# Patient Record
Sex: Female | Born: 1980 | Race: Black or African American | Hispanic: No | Marital: Single | Smoking: Current every day smoker
Health system: Southern US, Community
[De-identification: ages and names within clinical notes are randomized; demographics above are authoritative.]

## PROBLEM LIST (undated history)

## (undated) DIAGNOSIS — K7689 Other specified diseases of liver: Secondary | ICD-10-CM

## (undated) DIAGNOSIS — M6208 Separation of muscle (nontraumatic), other site: Secondary | ICD-10-CM

## (undated) DIAGNOSIS — G43909 Migraine, unspecified, not intractable, without status migrainosus: Secondary | ICD-10-CM

## (undated) DIAGNOSIS — E559 Vitamin D deficiency, unspecified: Secondary | ICD-10-CM

---

## 2013-01-10 HISTORY — PX: OTHER SURGICAL HISTORY: SHX169

## 2020-02-02 ENCOUNTER — Emergency Department (HOSPITAL_COMMUNITY)
Admission: EM | Admit: 2020-02-02 | Discharge: 2020-02-02 | Disposition: A | Discharge status: Home / Self Care | Payer: Medicaid Other | Attending: Emergency Medicine | Admitting: Emergency Medicine

## 2020-02-02 ENCOUNTER — Other Ambulatory Visit: Payer: Self-pay

## 2020-02-02 DIAGNOSIS — T148XXA Other injury of unspecified body region, initial encounter: Secondary | ICD-10-CM

## 2020-02-02 DIAGNOSIS — S0993XA Unspecified injury of face, initial encounter: Secondary | ICD-10-CM | POA: Diagnosis present

## 2020-02-02 DIAGNOSIS — S0083XA Contusion of other part of head, initial encounter: Secondary | ICD-10-CM | POA: Diagnosis not present

## 2020-02-02 DIAGNOSIS — X58XXXA Exposure to other specified factors, initial encounter: Secondary | ICD-10-CM | POA: Diagnosis not present

## 2020-02-02 NOTE — ED Triage Notes (Signed)
Patient reports she had a random hematoma show up on her head and then it went down but it is still tender and uncomfortable

## 2020-02-02 NOTE — ED Provider Notes (Signed)
[facility]-EMERGENCY DEPT Provider Note   CSN: [id number] Arrival date & time: 02/02/20  1359     History Chief Complaint  Patient presents with  . Facial Pain    Fabiana Dromgoole is a 40 y.o. female with no apparent past medical history that presents the emergency department today for facial hematoma that occurred yesterday.  Patient states that she had a random hematoma show up on her forehead yesterday, was a size of a nickel, stayed for a couple hours and then it disappeared.  Is not currently having hematoma.  Patient states that she came to get evaluated for this.  Does not have a picture of this.  States that the area is a little tender to touch right now. Has completley resolved. Has not taken anything for this.  Patient also states that she has some numbness around her eye.  Denies any vision changes, no blurry vision, photophobia, headaches, head trauma.  Denies any confusion, weakness, numbness, tingling, gait abnormality, neglect.  Denies any chest pain or shortness of breath.  Denies any fevers or chills.  Denies any nausea or vomiting.  Denies any bug bites that she is aware of.  States that it did not feel like a hive.  States that she is never had anything like this before.  Denies any unusual contacts or new contacts anything that she is aware of.  No other complaints at this time.  Denies any facial droop.    HPI     No past medical history on file.  There are no problems to display for this patient.   OB History   No obstetric history on file.     No family history on file.     Home Medications Prior to Admission medications   Not on File    Allergies    Amoxicillin  Review of Systems   Review of Systems  Constitutional: Negative for diaphoresis, fatigue and fever.  HENT: Negative for dental problem, ear pain, facial swelling, sinus pressure, sinus pain, trouble swallowing and voice change.        Hematoma  Eyes: Negative for visual  disturbance.  Respiratory: Negative for shortness of breath.   Cardiovascular: Negative for chest pain.  Gastrointestinal: Negative for nausea and vomiting.  Musculoskeletal: Negative for back pain and myalgias.  Skin: Negative for color change, pallor, rash and wound.  Neurological: Negative for syncope, weakness, light-headedness, numbness and headaches.  Psychiatric/Behavioral: Negative for behavioral problems and confusion.    Physical Exam Updated Vital Signs BP (!) 136/93 (BP Location: Right Arm)   Pulse 89   Temp 98.3 F (36.8 C) (Oral)   Resp 17   Ht 5\' 4"  (1.626 m)   Wt 69.4 kg   LMP 01/17/2020   SpO2 100%   BMI 26.26 kg/m   Physical Exam Constitutional:      General: She is not in acute distress.    Appearance: Normal appearance. She is not ill-appearing, toxic-appearing or diaphoretic.  HENT:     Head: Normocephalic and atraumatic. No raccoon eyes, right periorbital erythema or left periorbital erythema.     Salivary Glands: Right salivary gland is not tender.     Comments: No hematoma. No facial abnormalities. No true tenderness to frontal or maxillary area. No erythema or warmth. No facial swelling. Eyes:     General: Lids are normal. Lids are everted, no foreign bodies appreciated. Vision grossly intact. Gaze aligned appropriately.        Right eye: No  discharge.        Left eye: No discharge.     Extraocular Movements: Extraocular movements intact.     Right eye: Normal extraocular motion and no nystagmus.     Left eye: Normal extraocular motion and no nystagmus.     Conjunctiva/sclera: Conjunctivae normal.  Cardiovascular:     Rate and Rhythm: Normal rate and regular rhythm.     Pulses: Normal pulses.  Pulmonary:     Effort: Pulmonary effort is normal.     Breath sounds: Normal breath sounds.  Musculoskeletal:        General: Normal range of motion.  Skin:    General: Skin is warm and dry.     Capillary Refill: Capillary refill takes less than 2  seconds.  Neurological:     Mental Status: She is alert.     Comments: Alert. Clear speech. No facial droop. CNIII-XII grossly intact. Bilateral upper and lower extremities' sensation grossly intact. 5/5 symmetric strength with grip strength and with plantar and dorsi flexion bilaterally. Patellar DTRs are 2+ and symmetric . Normal finger to nose bilaterally. Negative pronator drift. Negative Romberg sign. Gait is steady and intact    Psychiatric:        Mood and Affect: Mood normal.        Behavior: Behavior normal.        Thought Content: Thought content normal.     ED Results / Procedures / Treatments   Labs (all labs ordered are listed, but only abnormal results are displayed) Labs Reviewed - No data to display  EKG None  Radiology No results found.  Procedures Procedures (including critical care time)  Medications Ordered in ED Medications - No data to display  ED Course  I have reviewed the triage vital signs and the nursing notes.  Pertinent labs & imaging results that were available during my care of the patient were reviewed by me and considered in my medical decision making (see chart for details).    MDM Rules/Calculators/A&P                         Kearah Gayden is a 40 y.o. female with no apparent past medical history that presents the emergency department today for facial hematoma that occurred yesterday. Patient does not currently have hematoma. Patient has completely normal neuro exam. No pain or facial tenderness palpated. Area is not warm, no concerns for cellulitis. No visual changes, visual acuity normal. No pain in eye. Unsure why patient had symptoms, did discuss patient could have had an insect bite or allergic reaction to something.  Symptomatic treatment discussed, patient to follow-up with PCP. Patient left before discharge paperwork was printed. Strict return precautions given to patient.  Doubt need for further emergent work up at this time. I  explained the diagnosis and have given explicit precautions to return to the ER including for any other new or worsening symptoms. The patient understands and accepts the medical plan as it's been dictated and I have answered their questions. Discharge instructions concerning home care and prescriptions have been given. The patient is STABLE and is discharged to home in good condition.  Final Clinical Impression(s) / ED Diagnoses Final diagnoses:  Hematoma    Rx / DC Orders ED Discharge Orders    None       Farrel Gordon, PA-C 02/02/20 1537    Bethann Berkshire, MD 02/03/20 2257

## 2020-05-19 ENCOUNTER — Emergency Department (HOSPITAL_COMMUNITY)
Admission: EM | Admit: 2020-05-19 | Discharge: 2020-05-19 | Disposition: A | Discharge status: Home / Self Care | Payer: Medicaid Other | Attending: Emergency Medicine | Admitting: Emergency Medicine

## 2020-05-19 ENCOUNTER — Emergency Department (HOSPITAL_COMMUNITY): Payer: Medicaid Other

## 2020-05-19 ENCOUNTER — Other Ambulatory Visit: Payer: Self-pay

## 2020-05-19 ENCOUNTER — Encounter (HOSPITAL_COMMUNITY): Payer: Self-pay | Admitting: Emergency Medicine

## 2020-05-19 DIAGNOSIS — W01198A Fall on same level from slipping, tripping and stumbling with subsequent striking against other object, initial encounter: Secondary | ICD-10-CM | POA: Diagnosis not present

## 2020-05-19 DIAGNOSIS — Y92002 Bathroom of unspecified non-institutional (private) residence single-family (private) house as the place of occurrence of the external cause: Secondary | ICD-10-CM | POA: Diagnosis not present

## 2020-05-19 DIAGNOSIS — S0990XA Unspecified injury of head, initial encounter: Secondary | ICD-10-CM | POA: Diagnosis present

## 2020-05-19 HISTORY — DX: Migraine, unspecified, not intractable, without status migrainosus: G43.909

## 2020-05-19 MED ORDER — ACETAMINOPHEN 325 MG PO TABS
650.0000 mg | ORAL_TABLET | Freq: Once | ORAL | Status: AC
  Administered 2020-05-19: 650 mg via ORAL
  Filled 2020-05-19: qty 2

## 2020-05-19 NOTE — ED Provider Notes (Signed)
Emergency Medicine Provider Triage Evaluation Note  Haley Gill , a 40 y.o. female  was evaluated in triage.  Pt complains of headache.  Patient states yesterday she tripped and fell and hit her head.  She was knocked out, but does not know for how long.  Today she continues to have pain of her right forehead.  She also reports numbness sensation of her forehead.  She is not taken anything including Tylenol ibuprofen.  She is not on blood thinners.  No neck or back pain.  No numbness or tingling.  No nausea or vomiting.  She does have photophobia.  History of migraines, states this feels different..  Review of Systems  Positive: HA, photophobia Negative: Neck pain  Physical Exam  BP (!) 139/94 (BP Location: Right Arm)   Pulse (!) 114   Temp 99.1 F (37.3 C) (Oral)   Resp 18   LMP 05/10/2020   SpO2 100%  Gen:   Awake, no distress   Resp:  Normal effort  MSK:   Moves extremities without difficulty  Other:  Chest palpation of the right forehead.  No obvious laceration.  No trismus or malocclusion.  Full active range of motion of the head without midline tenderness.  Medical Decision Making  Medically screening exam initiated at 4:32 PM.  Appropriate orders placed.  Haley Gill was informed that the remainder of the evaluation will be completed by another provider, this initial triage assessment does not replace that evaluation, and the importance of remaining in the ED until their evaluation is complete.  Ct ordered   Alveria Apley, PA-C 05/19/20 1634    Margarita Grizzle, MD 05/19/20 2351

## 2020-05-19 NOTE — Discharge Instructions (Addendum)
Take Tylenol or ibuprofen as needed for pain. Use ice to help with pain and swelling. Return to the emergency room if you develop severe worsening head pain, vision changes, slurred speech, double vision, or any new, sudden, or concerning symptoms.

## 2020-05-19 NOTE — ED Provider Notes (Signed)
[facility]-EMERGENCY DEPT Provider Note   CSN: [id number] Arrival date & time: 05/19/20  1609     History Chief Complaint  Patient presents with  . Head Injury    Haley Gill is a 40 y.o. female presenting for evaluation of headache.  Patient states last night she tripped over a box and fell, hitting her head on the bathtub.  She had loss of consciousness.  She continues to have headache and head pain today.  No vision changes, slurred speech, dizziness, lightheadedness.  No neck pain.  No numbness or tingling of the upper extremities.  She has not taken anything including Tylenol ibuprofen.  No nausea, vomiting, diarrhea.  HPI     Past Medical History:  Diagnosis Date  . Migraines     There are no problems to display for this patient.   History reviewed. No pertinent surgical history.   OB History   No obstetric history on file.     No family history on file.  Social History   Substance Use Topics  . Alcohol use: Yes    Home Medications Prior to Admission medications   Not on File    Allergies    Amoxicillin  Review of Systems   Review of Systems  Neurological: Positive for headaches.  All other systems reviewed and are negative.   Physical Exam Updated Vital Signs BP (!) 131/93 (BP Location: Left Arm)   Pulse 94   Temp 99.2 F (37.3 C) (Oral)   Resp 16   LMP 05/10/2020   SpO2 96%   Physical Exam Vitals and nursing note reviewed.  Constitutional:      General: She is not in acute distress.    Appearance: She is well-developed.     Comments: Resting in the bed in NAD  HENT:     Head: Normocephalic.      Comments: TTP of the R forehead with ?mild swelling.  No laceration.  No trismus or malocclusion.  EOMI without entrapment.  No tenderness palpation over the nasal bridge Eyes:     Conjunctiva/sclera: Conjunctivae normal.     Pupils: Pupils are equal, round, and reactive to light.  Cardiovascular:     Rate and  Rhythm: Normal rate and regular rhythm.     Pulses: Normal pulses.  Pulmonary:     Effort: Pulmonary effort is normal. No respiratory distress.     Breath sounds: Normal breath sounds. No wheezing.  Abdominal:     General: There is no distension.     Palpations: Abdomen is soft. There is no mass.     Tenderness: There is no abdominal tenderness. There is no guarding or rebound.  Musculoskeletal:        General: Normal range of motion.     Cervical back: Normal range of motion and neck supple.  Skin:    General: Skin is warm and dry.     Capillary Refill: Capillary refill takes less than 2 seconds.  Neurological:     Mental Status: She is alert and oriented to person, place, and time.     ED Results / Procedures / Treatments   Labs (all labs ordered are listed, but only abnormal results are displayed) Labs Reviewed - No data to display  EKG None  Radiology CT Head Wo Contrast  Result Date: 05/19/2020 CLINICAL DATA:  Head trauma, moderate to severe. EXAM: CT HEAD WITHOUT CONTRAST TECHNIQUE: Contiguous axial images were obtained from the base of the skull through the vertex without  intravenous contrast. COMPARISON:  None. FINDINGS: Brain: No acute intracranial hemorrhage. No focal mass lesion. No CT evidence of acute infarction. No midline shift or mass effect. No hydrocephalus. Basilar cisterns are patent. Vascular: No hyperdense vessel or unexpected calcification. Skull: Normal. Negative for fracture or focal lesion. Sinuses/Orbits: Paranasal sinuses and mastoid air cells are clear. Orbits are clear. Other: None. IMPRESSION: No intracranial trauma. Electronically Signed   By: Genevive Bi M.D.   On: 05/19/2020 17:56    Procedures Procedures   Medications Ordered in ED Medications  acetaminophen (TYLENOL) tablet 650 mg (has no administration in time range)    ED Course  I have reviewed the triage vital signs and the nursing notes.  Pertinent labs & imaging results that  were available during my care of the patient were reviewed by me and considered in my medical decision making (see chart for details).    MDM Rules/Calculators/A&P                          Patient presenting for evaluation of headache and head pain after fall yesterday.  On exam, patient peers nontoxic.  She is neurovascular intact.  CT head negative for acute findings.  Discussed with patient.  Discussed symptomatic management.  At this time, patient appears safe for discharge.  Return precautions given.  Patient states she understands and agrees to plan.  Final Clinical Impression(s) / ED Diagnoses Final diagnoses:  Injury of head, initial encounter    Rx / DC Orders ED Discharge Orders    None       Alveria Apley, PA-C 05/19/20 1910    Margarita Grizzle, MD 05/20/20 [phone]

## 2020-05-19 NOTE — ED Triage Notes (Signed)
Pt reports last night she had two glasses of wine and she tripped over something in her bathroom and fell hitting her head on her bath tub. Reports that her boyfriend found her on the ground in bathroom. Unsure how long she was knocked out for. Reports headache and loss of sensation on her forehead when she touches.

## 2020-05-24 ENCOUNTER — Other Ambulatory Visit: Payer: Self-pay

## 2020-05-24 ENCOUNTER — Emergency Department (HOSPITAL_COMMUNITY)
Admission: EM | Admit: 2020-05-24 | Discharge: 2020-05-24 | Disposition: A | Discharge status: Home / Self Care | Payer: Medicaid Other | Attending: Emergency Medicine | Admitting: Emergency Medicine

## 2020-05-24 ENCOUNTER — Encounter (HOSPITAL_COMMUNITY): Payer: Self-pay | Admitting: Emergency Medicine

## 2020-05-24 DIAGNOSIS — T148XXA Other injury of unspecified body region, initial encounter: Secondary | ICD-10-CM

## 2020-05-24 DIAGNOSIS — S060X9D Concussion with loss of consciousness of unspecified duration, subsequent encounter: Secondary | ICD-10-CM | POA: Diagnosis not present

## 2020-05-24 DIAGNOSIS — W01198D Fall on same level from slipping, tripping and stumbling with subsequent striking against other object, subsequent encounter: Secondary | ICD-10-CM | POA: Diagnosis not present

## 2020-05-24 DIAGNOSIS — Y92002 Bathroom of unspecified non-institutional (private) residence single-family (private) house as the place of occurrence of the external cause: Secondary | ICD-10-CM | POA: Diagnosis not present

## 2020-05-24 DIAGNOSIS — S161XXD Strain of muscle, fascia and tendon at neck level, subsequent encounter: Secondary | ICD-10-CM | POA: Diagnosis not present

## 2020-05-24 DIAGNOSIS — S0990XD Unspecified injury of head, subsequent encounter: Secondary | ICD-10-CM | POA: Diagnosis present

## 2020-05-24 MED ORDER — ACETAMINOPHEN 500 MG PO TABS
1000.0000 mg | ORAL_TABLET | Freq: Once | ORAL | Status: AC
  Administered 2020-05-24: 1000 mg via ORAL
  Filled 2020-05-24: qty 2

## 2020-05-24 NOTE — ED Triage Notes (Signed)
Pt states she tripped over something and hit her head on the bathtub on 5/9. She was seen at [facility] the next day, c/o continued headache, back pain and loss of sensation to the right side of her forehead.

## 2020-05-24 NOTE — ED Provider Notes (Signed)
[facility] EMERGENCY DEPARTMENT Provider Note  CSN: [id number] Arrival date & time: 05/24/20 1802  Chief Complaint(s) Fall  HPI Haley Gill is a 40 y.o. female here for continued headache following a mechanical fall 1 week ago.  Patient reports loss of consciousness.  Patient was not evaluated that day but went to [facility] the following day where she had a negative CT head.  Patient presents for continued and worsening headache.  This is different than her prior headaches.  She is endorsing right parietal headache described as a pressure.  No visual changes.  Patient reports no alleviating or aggravating factors but winces when touching her scalp.  She denies any focal deficits.  She is endorsing numbness and tingling to right forehead.  No nausea or vomiting no other physical complaints.  HPI  Past Medical History Past Medical History:  Diagnosis Date  . Migraines    There are no problems to display for this patient.  Home Medication(s) Prior to Admission medications   Not on File                                                                                                                                    Past Surgical History History reviewed. No pertinent surgical history. Family History No family history on file.  Social History Social History   Substance Use Topics  . Alcohol use: Yes   Allergies Amoxicillin  Review of Systems Review of Systems All other systems are reviewed and are negative for acute change except as noted in the HPI  Physical Exam Vital Signs  I have reviewed the triage vital signs BP (!) 116/92 (BP Location: Left Arm)   Pulse 72   Temp 99.1 F (37.3 C) (Oral)   Resp 18   Ht 5\' 4"  (1.626 m)   Wt 68 kg   LMP 05/10/2020   SpO2 100%   BMI 25.75 kg/m   Physical Exam Vitals reviewed.  Constitutional:      General: She is not in acute distress.    Appearance: She is well-developed. She is not diaphoretic.   HENT:     Head: Normocephalic. Contusion present. No raccoon eyes, Battle's sign, abrasion or laceration.      Right Ear: External ear normal.     Left Ear: External ear normal.     Nose: Nose normal.  Eyes:     General: No scleral icterus.    Conjunctiva/sclera: Conjunctivae normal.  Neck:     Trachea: Phonation normal.  Cardiovascular:     Rate and Rhythm: Normal rate and regular rhythm.  Pulmonary:     Effort: Pulmonary effort is normal. No respiratory distress.     Breath sounds: No stridor.  Abdominal:     General: There is no distension.  Musculoskeletal:        General: Normal range of motion.     Cervical back: Normal range of  motion. Spasms and tenderness present. No bony tenderness.       Back:  Neurological:     Mental Status: She is alert and oriented to person, place, and time.     Comments: Mental Status:  Alert and oriented to person, place, and time.  Attention and concentration normal.  Speech clear.  Recent memory is intact  Cranial Nerves:  II Visual Fields: Intact to confrontation. Visual fields intact. III, IV, VI: Pupils equal and reactive to light and near. Full eye movement without nystagmus  V Facial Sensation: Normal. No weakness of masticatory muscles  VII: No facial weakness or asymmetry  VIII Auditory Acuity: Grossly normal  IX/X: The uvula is midline; the palate elevates symmetrically  XI: Normal sternocleidomastoid and trapezius strength  XII: The tongue is midline. No atrophy or fasciculations.   Motor System: Muscle Strength: 5/5 and symmetric in the upper and lower extremities. No pronation or drift.  Muscle Tone: Tone and muscle bulk are normal in the upper and lower extremities.   Coordination:  No tremor.  Sensation: Intact to light touch  Gait: Routine gait normal.    Psychiatric:        Behavior: Behavior normal.     ED Results and Treatments Labs (all labs ordered are listed, but only abnormal results are  displayed) Labs Reviewed - No data to display                                                                                                                       EKG  EKG Interpretation  Date/Time:    Ventricular Rate:    PR Interval:    QRS Duration:   QT Interval:    QTC Calculation:   R Axis:     Text Interpretation:        Radiology No results found.  Pertinent labs & imaging results that were available during my care of the patient were reviewed by me and considered in my medical decision making (see chart for details).  Medications Ordered in ED Medications  acetaminophen (TYLENOL) tablet 1,000 mg (has no administration in time range)                                                                                                                                    Procedures Procedures  (including critical care time)  Medical Decision Making / ED Course I have reviewed the  nursing notes for this encounter and the patient's prior records (if available in EHR or on provided paperwork).   Haley Gill was evaluated in Emergency Department on 05/24/2020 for the symptoms described in the history of present illness. She was evaluated in the context of the global COVID-19 pandemic, which necessitated consideration that the patient might be at risk for infection with the SARS-CoV-2 virus that causes COVID-19. Institutional protocols and algorithms that pertain to the evaluation of patients at risk for COVID-19 are in a state of rapid change based on information released by regulatory bodies including the CDC and federal and state organizations. These policies and algorithms were followed during the patient's care in the ED.  Consistent with scalp contusion, muscle strain of upper back muscles, and likely post concussion syndrome. No neuro deficits. No need for repeat imaging. Continued supportive management recommended. PCP/Concussion specialist follow up recommended.       Final Clinical Impression(s) / ED Diagnoses Final diagnoses:  Concussion with loss of consciousness, subsequent encounter  Muscle strain   The patient appears reasonably screened and/or stabilized for discharge and I doubt any other medical condition or other [facility] requiring further screening, evaluation, or treatment in the ED at this time prior to discharge. Safe for discharge with strict return precautions.  Disposition: Discharge  Condition: Good  I have discussed the results, Dx and Tx plan with the patient/family who expressed understanding and agree(s) with the plan. Discharge instructions discussed at length. The patient/family was given strict return precautions who verbalized understanding of the instructions. No further questions at time of discharge.    ED Discharge Orders    None       Follow Up: Primary care provider  Call  to schedule an appointment for close follow up  [place]ud[place]all, Azucena Fallen, MD [address] [place] [address] [phone]  Call  to schedule an appointment for close follow up to assess for concussive syndrome      This chart was dictated using voice recognition software.  Despite best efforts to proofread,  errors can occur which can change the documentation meaning.   Nira Conn, MD 05/24/20 2330

## 2020-05-24 NOTE — Discharge Instructions (Signed)
You may use over-the-counter Motrin (Ibuprofen), Acetaminophen (Tylenol), topical muscle creams such as SalonPas, Icy Hot, Bengay, etc. Please stretch, apply ice or heat (whichever helps), and have massage therapy for additional assistance.  

## 2020-09-03 ENCOUNTER — Other Ambulatory Visit: Payer: Self-pay

## 2020-09-03 ENCOUNTER — Encounter (HOSPITAL_BASED_OUTPATIENT_CLINIC_OR_DEPARTMENT_OTHER): Payer: Self-pay

## 2020-09-03 DIAGNOSIS — F17210 Nicotine dependence, cigarettes, uncomplicated: Secondary | ICD-10-CM | POA: Insufficient documentation

## 2020-09-03 DIAGNOSIS — R21 Rash and other nonspecific skin eruption: Secondary | ICD-10-CM | POA: Insufficient documentation

## 2020-09-03 NOTE — ED Triage Notes (Signed)
Pt states she had a?insect bite to forehead x 1 week ago-c/o scattered rash to face and neck-NAD-steady gait

## 2020-09-04 ENCOUNTER — Emergency Department (HOSPITAL_BASED_OUTPATIENT_CLINIC_OR_DEPARTMENT_OTHER)
Admission: EM | Admit: 2020-09-04 | Discharge: 2020-09-04 | Disposition: A | Discharge status: Home / Self Care | Payer: Medicaid Other | Attending: Emergency Medicine | Admitting: Emergency Medicine

## 2020-09-04 DIAGNOSIS — R21 Rash and other nonspecific skin eruption: Secondary | ICD-10-CM

## 2020-09-04 NOTE — ED Provider Notes (Signed)
[facility] Provider Note: Lowella Dell, MD, FACEP  CSN: [id number] MRN: [id number] ARRIVAL: 09/03/20 at 2226 ROOM: [facility]08/[facility]08   CHIEF COMPLAINT  Rash   HISTORY OF PRESENT ILLNESS  09/04/20 1:41 AM Haley Gill is a 40 y.o. female with a 1 week history of a rash on her face and neck.  The rash consists of annular slightly raised macular lesions.  They do not itch.  They do not hurt.  They are not vesicular or bullous.  They have not been associated with any systemic symptoms such as fever.  She started noticing them after she had an insect bite her on the middle of the forehead at a barbecue.  She has been applying alcohol to "dry them out".   Past Medical History:  Diagnosis Date   Migraines     History reviewed. No pertinent surgical history.  No family history on file.  Social History   Tobacco Use   Smoking status: Every Day    Types: Cigarettes   Smokeless tobacco: Never  Vaping Use   Vaping Use: Never used  Substance Use Topics   Alcohol use: Yes    Comment: weekly    Prior to Admission medications   Not on File    Allergies Amoxicillin   REVIEW OF SYSTEMS  Negative except as noted here or in the History of Present Illness.   PHYSICAL EXAMINATION  Initial Vital Signs Blood pressure 132/83, pulse 72, temperature 98.3 F (36.8 C), temperature source Oral, resp. rate 18, height 5\' 3"  (1.6 m), weight 67.1 kg, last menstrual period 08/28/2020, SpO2 100 %.  Examination General: Well-developed, well-nourished female in no acute distress; appearance consistent with age of record HENT: normocephalic; atraumatic Eyes: Normal appearance Neck: supple Heart: regular rate and rhythm Lungs: clear to auscultation bilaterally Abdomen: soft; nondistended; nontender; bowel sounds present Extremities: No deformity; full range of motion Neurologic: Awake, alert and oriented; motor function intact in all extremities and symmetric; no facial droop Skin:  Warm and dry; maculopapular rash with enhanced annular rims of the face and neck but not extending to trunk or extremities:      Psychiatric: Normal mood and affect   RESULTS  Summary of this visit's results, reviewed and interpreted by myself:   EKG Interpretation  Date/Time:    Ventricular Rate:    PR Interval:    QRS Duration:   QT Interval:    QTC Calculation:   R Axis:     Text Interpretation:         Laboratory Studies: No results found for this or any previous visit (from the past 24 hour(s)). Imaging Studies: No results found.  ED COURSE and MDM  Nursing notes, initial and subsequent vitals signs, including pulse oximetry, reviewed and interpreted by myself.  Vitals:   09/03/20 2236 09/03/20 2237  BP: 132/83   Pulse: 72   Resp: 18   Temp: 98.3 F (36.8 C)   TempSrc: Oral   SpO2: 100%   Weight:  67.1 kg  Height:  5\' 3"  (1.6 m)   Medications - No data to display  The rash is not consistent with, and the patient has no risk factors for, monkey pox.  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  The patient is otherwise asymptomatic.  We will refer to dermatology for further evaluation.  PROCEDURES  Procedures   ED DIAGNOSES     ICD-10-CM   1. Rash and nonspecific skin eruption  R21          Paula Libra, MD 09/04/20 [phone]

## 2021-05-07 ENCOUNTER — Other Ambulatory Visit: Payer: Self-pay

## 2021-05-07 ENCOUNTER — Emergency Department (HOSPITAL_BASED_OUTPATIENT_CLINIC_OR_DEPARTMENT_OTHER): Payer: Medicaid Other

## 2021-05-07 ENCOUNTER — Emergency Department (HOSPITAL_BASED_OUTPATIENT_CLINIC_OR_DEPARTMENT_OTHER)
Admission: EM | Admit: 2021-05-07 | Discharge: 2021-05-07 | Disposition: A | Discharge status: Home / Self Care | Payer: Medicaid Other | Attending: Emergency Medicine | Admitting: Emergency Medicine

## 2021-05-07 ENCOUNTER — Encounter (HOSPITAL_BASED_OUTPATIENT_CLINIC_OR_DEPARTMENT_OTHER): Payer: Self-pay | Admitting: Emergency Medicine

## 2021-05-07 DIAGNOSIS — R109 Unspecified abdominal pain: Secondary | ICD-10-CM | POA: Insufficient documentation

## 2021-05-07 HISTORY — DX: Separation of muscle (nontraumatic), other site: M62.08

## 2021-05-07 HISTORY — DX: Vitamin D deficiency, unspecified: E55.9

## 2021-05-07 HISTORY — DX: Other specified diseases of liver: K76.89

## 2021-05-07 LAB — PREGNANCY, URINE: Preg Test, Ur: NEGATIVE

## 2021-05-07 LAB — URINALYSIS, MICROSCOPIC (REFLEX)

## 2021-05-07 LAB — URINALYSIS, ROUTINE W REFLEX MICROSCOPIC
Bilirubin Urine: NEGATIVE
Glucose, UA: NEGATIVE mg/dL
Ketones, ur: NEGATIVE mg/dL
Leukocytes,Ua: NEGATIVE
Nitrite: NEGATIVE
Protein, ur: NEGATIVE mg/dL
Specific Gravity, Urine: >=1.03 (ref 1.005–1.030)
pH: 5.5 (ref 5.0–8.0)

## 2021-05-07 MED ORDER — FLUCONAZOLE 150 MG PO TABS
150.0000 mg | ORAL_TABLET | Freq: Once | ORAL | Status: AC
Start: 2021-05-07 — End: 2021-05-07
  Administered 2021-05-07: 150 mg via ORAL
  Filled 2021-05-07: qty 1

## 2021-05-07 MED ORDER — ACETAMINOPHEN 500 MG PO TABS
1000.0000 mg | ORAL_TABLET | Freq: Once | ORAL | Status: AC
Start: 2021-05-07 — End: 2021-05-07
  Administered 2021-05-07: 1000 mg via ORAL
  Filled 2021-05-07: qty 2

## 2021-05-07 MED ORDER — OXYCODONE HCL 5 MG PO TABS
5.0000 mg | ORAL_TABLET | Freq: Once | ORAL | Status: AC
  Administered 2021-05-07: 5 mg via ORAL
  Filled 2021-05-07: qty 1

## 2021-05-07 NOTE — ED Triage Notes (Signed)
Pt c/o left flank pain. Pt states she read on mychart that she has a cyst on her left kidney.  ?

## 2021-05-07 NOTE — ED Provider Notes (Signed)
?[facility] EMERGENCY DEPARTMENT ?Provider Note ? ? ?CSN: [id number] ?Arrival date & time: 05/07/21  0020 ? ?  ? ?History ? ?Chief Complaint  ?Patient presents with  ? Flank Pain  ? ? ?Haley Gill is a 41 y.o. female. ? ?41 yo F with a chief complaint of left flank pain.  This been going on for about a week.  Is been pretty consistent.  Goes from her left costal margin about the axillary line down to her left iliac crest.  Nothing seems to make it better or worse.  Feels kind of like a bloating sensation.  Denies rash denies trauma.  Denies radiation to the legs.  Denies fevers. ? ? ?Flank Pain ? ? ?  ? ?Home Medications ?Prior to Admission medications   ?Not on File  ?   ? ?Allergies    ?Amoxicillin   ? ?Review of Systems   ?Review of Systems  ?Genitourinary:  Positive for flank pain.  ? ?Physical Exam ?Updated Vital Signs ?BP (!) 139/93   Pulse 77   Temp 98 ?F (36.7 ?C) (Oral)   Resp 18   Ht 5\' 3"  (1.6 m)   Wt 67 kg   SpO2 100%   BMI 26.17 kg/m?  ?Physical Exam ?Vitals and nursing note reviewed.  ?Constitutional:   ?   General: She is not in acute distress. ?   Appearance: She is well-developed. She is not diaphoretic.  ?HENT:  ?   Head: Normocephalic and atraumatic.  ?Eyes:  ?   Pupils: Pupils are equal, round, and reactive to light.  ?Cardiovascular:  ?   Rate and Rhythm: Normal rate and regular rhythm.  ?   Heart sounds: No murmur heard. ?  No friction rub. No gallop.  ?Pulmonary:  ?   Effort: Pulmonary effort is normal.  ?   Breath sounds: No wheezing or rales.  ?Abdominal:  ?   General: There is no distension.  ?   Palpations: Abdomen is soft.  ?   Tenderness: There is no abdominal tenderness.  ?Musculoskeletal:     ?   General: Tenderness present.  ?   Cervical back: Normal range of motion and neck supple.  ?   Comments: Tenderness to the paraspinal musculature about t 8 through 12.  ?Skin: ?   General: Skin is warm and dry.  ?Neurological:  ?   Mental Status: She is alert and oriented to  person, place, and time.  ?Psychiatric:     ?   Behavior: Behavior normal.  ? ? ?ED Results / Procedures / Treatments   ?Labs ?(all labs ordered are listed, but only abnormal results are displayed) ?Labs Reviewed  ?URINALYSIS, ROUTINE W REFLEX MICROSCOPIC - Abnormal; Notable for the following components:  ?    Result Value  ? Hgb urine dipstick MODERATE (*)   ? All other components within normal limits  ?URINALYSIS, MICROSCOPIC (REFLEX) - Abnormal; Notable for the following components:  ? Bacteria, UA MANY (*)   ? All other components within normal limits  ?PREGNANCY, URINE  ? ? ?EKG ?None ? ?Radiology ?CT Renal Stone Study ? ?Result Date: 05/07/2021 ?CLINICAL DATA:  Left flank pain EXAM: CT ABDOMEN AND PELVIS WITHOUT CONTRAST TECHNIQUE: Multidetector CT imaging of the abdomen and pelvis was performed following the standard protocol without IV contrast. RADIATION DOSE REDUCTION: This exam was performed according to the departmental dose-optimization program which includes automated exposure control, adjustment of the mA and/or kV according to patient size and/or use of  iterative reconstruction technique. COMPARISON:  CT 08/23/2019 and MRI 09/04/2019. FINDINGS: Lower chest: No acute findings Hepatobiliary: No focal hepatic abnormality. Gallbladder unremarkable. Pancreas: No focal abnormality or ductal dilatation. Spleen: No focal abnormality.  Normal size. Adrenals/Urinary Tract: No adrenal abnormality. No focal renal abnormality. No stones or hydronephrosis. Urinary bladder is unremarkable. Stomach/Bowel: Normal appendix. Stomach, large and small bowel grossly unremarkable. Vascular/Lymphatic: No evidence of aneurysm or adenopathy. Reproductive: Uterus and adnexa unremarkable.  No mass. Other: No free fluid or free air. Musculoskeletal: No acute bony abnormality. IMPRESSION: No acute findings in the abdomen or pelvis. No renal or ureteral stones. No hydronephrosis. Electronically Signed   By: Charlett NoseKevin  Dover M.D.   On:  05/07/2021 01:19   ? ?Procedures ?Procedures  ? ? ?Medications Ordered in ED ?Medications  ?fluconazole (DIFLUCAN) tablet 150 mg (has no administration in time range)  ?acetaminophen (TYLENOL) tablet 1,000 mg (1,000 mg Oral Given 05/07/21 0103)  ?oxyCODONE (Oxy IR/ROXICODONE) immediate release tablet 5 mg (5 mg Oral Given 05/07/21 0103)  ? ? ?ED Course/ Medical Decision Making/ A&P ?  ?                        ?Medical Decision Making ?Amount and/or Complexity of Data Reviewed ?Labs: ordered. ?Radiology: ordered. ? ?Risk ?OTC drugs. ?Prescription drug management. ? ? ?41 yo F with a chief complaint of left flank pain.  This been going on for about a week.  She had looked on my chart and realized that she had a cyst that was incidentally seen on an MRI of the liver and she is worried that maybe that is causing her discomfort.  Most likely musculoskeletal by history and physical.  Awaiting UA. ? ?Patient's pregnancy test is negative.  Her UA does show moderate blood with 11-20 reds.  Her story is very atypical for kidney stone that this may change the timing of for follow-up.  We will obtain a CT stone study. ? ?CT stone study is negative for kidney stone.  The patient's UA had too numerous Bacteria although she has no urinary symptoms and has no white cells was nitrite negative.  I do not feel clinically this is a urinary tract infection.  She did have yeast on her UA.  We will give a dose of Diflucan here.  PCP follow-up. ? ?1:24 AM:  I have discussed the diagnosis/risks/treatment options with the patient and family.  Evaluation and diagnostic testing in the emergency department does not suggest an emergent condition requiring admission or immediate intervention beyond what has been performed at this time.  They will follow up with  PCP. We also discussed returning to the ED immediately if new or worsening sx occur. We discussed the sx which are most concerning (e.g., sudden worsening pain, fever, inability to tolerate  by mouth) that necessitate immediate return. Medications administered to the patient during their visit and any new prescriptions provided to the patient are listed below. ? ?Medications given during this visit ?Medications  ?fluconazole (DIFLUCAN) tablet 150 mg (has no administration in time range)  ?acetaminophen (TYLENOL) tablet 1,000 mg (1,000 mg Oral Given 05/07/21 0103)  ?oxyCODONE (Oxy IR/ROXICODONE) immediate release tablet 5 mg (5 mg Oral Given 05/07/21 0103)  ? ? ? ?The patient appears reasonably screen and/or stabilized for discharge and I doubt any other medical condition or other [facility] requiring further screening, evaluation, or treatment in the ED at this time prior to discharge.  ? ? ? ? ? ? ? ? ?  Final Clinical Impression(s) / ED Diagnoses ?Final diagnoses:  ?Flank pain  ? ? ?Rx / DC Orders ?ED Discharge Orders   ? ? None  ? ?  ? ? ?  ?Melene Plan, DO ?05/07/21 0124 ? ?

## 2021-05-07 NOTE — Discharge Instructions (Signed)

## 2022-10-10 ENCOUNTER — Emergency Department (HOSPITAL_BASED_OUTPATIENT_CLINIC_OR_DEPARTMENT_OTHER): Payer: Medicaid Other

## 2022-10-10 ENCOUNTER — Encounter (HOSPITAL_BASED_OUTPATIENT_CLINIC_OR_DEPARTMENT_OTHER): Payer: Self-pay | Admitting: Emergency Medicine

## 2022-10-10 ENCOUNTER — Other Ambulatory Visit: Payer: Self-pay

## 2022-10-10 ENCOUNTER — Emergency Department (HOSPITAL_BASED_OUTPATIENT_CLINIC_OR_DEPARTMENT_OTHER)
Admission: EM | Admit: 2022-10-10 | Discharge: 2022-10-10 | Disposition: A | Discharge status: Home / Self Care | Payer: Medicaid Other | Attending: Emergency Medicine | Admitting: Emergency Medicine

## 2022-10-10 DIAGNOSIS — K0381 Cracked tooth: Secondary | ICD-10-CM | POA: Diagnosis not present

## 2022-10-10 DIAGNOSIS — K047 Periapical abscess without sinus: Secondary | ICD-10-CM | POA: Diagnosis not present

## 2022-10-10 DIAGNOSIS — R220 Localized swelling, mass and lump, head: Secondary | ICD-10-CM | POA: Diagnosis present

## 2022-10-10 DIAGNOSIS — D649 Anemia, unspecified: Secondary | ICD-10-CM | POA: Diagnosis not present

## 2022-10-10 DIAGNOSIS — R200 Anesthesia of skin: Secondary | ICD-10-CM | POA: Diagnosis not present

## 2022-10-10 DIAGNOSIS — K029 Dental caries, unspecified: Secondary | ICD-10-CM | POA: Diagnosis not present

## 2022-10-10 LAB — COMPREHENSIVE METABOLIC PANEL
ALT: 22 U/L (ref 0–44)
AST: 25 U/L (ref 15–41)
Albumin: 3.8 g/dL (ref 3.5–5.0)
Alkaline Phosphatase: 51 U/L (ref 38–126)
Anion gap: 9 (ref 5–15)
BUN: 13 mg/dL (ref 6–20)
CO2: 25 mmol/L (ref 22–32)
Calcium: 9.3 mg/dL (ref 8.9–10.3)
Chloride: 103 mmol/L (ref 98–111)
Creatinine, Ser: 0.64 mg/dL (ref 0.44–1.00)
GFR, Estimated: >60 mL/min (ref >60)
Glucose, Bld: 94 mg/dL (ref 70–99)
Potassium: 4.5 mmol/L (ref 3.5–5.1)
Sodium: 137 mmol/L (ref 135–145)
Total Bilirubin: 0.2 mg/dL — ABNORMAL LOW (ref 0.3–1.2)
Total Protein: 7.8 g/dL (ref 6.5–8.1)

## 2022-10-10 LAB — CBC WITH DIFFERENTIAL/PLATELET
Abs Immature Granulocytes: 0.02 10*3/uL (ref 0.00–0.07)
Basophils Absolute: 0 10*3/uL (ref 0.0–0.1)
Basophils Relative: 1 %
Eosinophils Absolute: 0.2 10*3/uL (ref 0.0–0.5)
Eosinophils Relative: 4 %
HCT: 31 % — ABNORMAL LOW (ref 36.0–46.0)
Hemoglobin: 9.5 g/dL — ABNORMAL LOW (ref 12.0–15.0)
Immature Granulocytes: 0 %
Lymphocytes Relative: 25 %
Lymphs Abs: 1.3 10*3/uL (ref 0.7–4.0)
MCH: 22.8 pg — ABNORMAL LOW (ref 26.0–34.0)
MCHC: 30.6 g/dL (ref 30.0–36.0)
MCV: 74.3 fL — ABNORMAL LOW (ref 80.0–100.0)
Monocytes Absolute: 0.5 10*3/uL (ref 0.1–1.0)
Monocytes Relative: 10 %
Neutro Abs: 3.3 10*3/uL (ref 1.7–7.7)
Neutrophils Relative %: 60 %
Platelets: 451 10*3/uL — ABNORMAL HIGH (ref 150–400)
RBC: 4.17 MIL/uL (ref 3.87–5.11)
RDW: 17.2 % — ABNORMAL HIGH (ref 11.5–15.5)
WBC: 5.4 10*3/uL (ref 4.0–10.5)
nRBC: 0 % (ref 0.0–0.2)

## 2022-10-10 LAB — HCG, QUANTITATIVE, PREGNANCY: hCG, Beta Chain, Quant, S: 1 m[IU]/mL (ref <5)

## 2022-10-10 MED ORDER — IOHEXOL 300 MG/ML  SOLN
75.0000 mL | Freq: Once | INTRAMUSCULAR | Status: AC | PRN
  Administered 2022-10-10: 75 mL via INTRAVENOUS

## 2022-10-10 MED ORDER — KETOROLAC TROMETHAMINE 15 MG/ML IJ SOLN
15.0000 mg | Freq: Once | INTRAMUSCULAR | Status: AC
Start: 2022-10-10 — End: 2022-10-10
  Administered 2022-10-10: 15 mg via INTRAVENOUS
  Filled 2022-10-10: qty 1

## 2022-10-10 MED ORDER — CLINDAMYCIN HCL 150 MG PO CAPS
450.0000 mg | ORAL_CAPSULE | Freq: Three times a day (TID) | ORAL | 0 refills | Status: AC
Start: 2022-10-10 — End: 2022-10-17

## 2022-10-10 MED ORDER — CLINDAMYCIN HCL 150 MG PO CAPS
450.0000 mg | ORAL_CAPSULE | Freq: Once | ORAL | Status: AC
  Administered 2022-10-10: 450 mg via ORAL
  Filled 2022-10-10: qty 3

## 2022-10-10 NOTE — Discharge Instructions (Addendum)
You were seen in the ER for evaluation of your facial swelling. I likely think this is from your teeth. I am going to prescribe you an antibiotic for this. Please take as prescribed. For pain, I recommend 1000mg  of Tylenol and 600mg  of ibuprofen every 6 hours as needed for pain. You likely need to follow up with a dentist. I have included the information for them in the discharge paperwork. If you change your mind about the MRI, please return to [facility] or [facility]. If you have any concerns, new or worsening symptoms, please return to the ER for re-evaluation.   Contact a doctor if: Your pain is worse and medicine does not help. Get help right away if: You have a fever or chills. Your symptoms suddenly get worse. You have a very bad headache. You have problems breathing or swallowing. You have trouble opening your mouth. You have swelling in your neck or close to your eye. These symptoms may be an emergency. Get help right away. Call your local emergency services (911 in the [place] Do not wait to see if the symptoms will go away. Do not drive yourself to the hospital.

## 2022-10-10 NOTE — ED Provider Notes (Signed)
[place] EMERGENCY DEPARTMENT AT [facility] Provider Note   CSN: [id number] Arrival date & time: 10/10/22  1129     History Chief Complaint  Patient presents with   Facial Swelling    Haley Gill is a 42 y.o. female reportedly otherwise healthy presents to the ER for evaluation of right-sided facial swelling for the past 5 days. The patient reports that there was pain and swelling for the first two days. The next day she started to have right sided facial numbness as well. She reports mild headaches, but this does feel consistent with her previous. She reports that she does have poor dentition and thought it may have been with her teeth. She does any visual problems or pain with moving her eyes. She denies any fevers. Denies any neck pain or stiffness. She has been taking her brother's left over clindamycin prescription and reports that it has significantly helped her swelling. Denies any trouble with PO intact. Allergic to amoxicillin.   HPI     Home Medications Prior to Admission medications   Medication Sig Start Date End Date Taking? Authorizing Provider  clindamycin (CLEOCIN) 150 MG capsule Take 3 capsules (450 mg total) by mouth 3 (three) times daily for 7 days. 10/10/22 10/17/22 Yes Achille Rich, PA-C      Allergies    Amoxicillin    Review of Systems   Review of Systems  Constitutional:  Negative for chills and fever.  HENT:  Positive for dental problem and facial swelling. Negative for congestion, rhinorrhea, sore throat and trouble swallowing.   Respiratory:  Negative for shortness of breath.   Musculoskeletal:  Negative for neck pain.  Neurological:  Positive for numbness.    Physical Exam Updated Vital Signs BP 113/84   Pulse 77   Temp 98 F (36.7 C)   Resp 18   Wt 65.8 kg   LMP 09/30/2022 (Approximate)   SpO2 100%   BMI 25.69 kg/m  Physical Exam Vitals and nursing note reviewed.  Constitutional:      General: She is not in acute  distress.    Appearance: Normal appearance. She is not ill-appearing or toxic-appearing.  HENT:     Head: Normocephalic.     Comments: The patient does have some swelling noted near the nasolabial fold on the right going below both the nares on the upper lip.  There is no overlying erythema or warmth however the area is tender.  No tenderness to the more lateral aspect of the face, mainly is contracted in this area.  I do not appreciate any significant facial swelling.  No periorbital swelling.  Movement with her eyes without any significant pain.  PERRLA.  EOMI.    Right Ear: Tympanic membrane, ear canal and external ear normal.     Left Ear: Tympanic membrane, ear canal and external ear normal.     Nose: Nose normal.     Mouth/Throat:     Mouth: Mucous membranes are moist.     Comments: Poor dentition noted throughout.  Patient does have pain in the upper anterior area above the incisors near the frenulum.  I do palpate some induration here however there is no mass or fluctuance seen.  No central pustule seen.  Patient does have multiple caries and broken teeth throughout.  No subungual elevation.  No trismus.  Controlling secretions.  Airway patent and uvula is midline.   Eyes:     General: No scleral icterus.    Extraocular Movements: Extraocular movements  intact.     Pupils: Pupils are equal, round, and reactive to light.  Neck:     Comments: No swelling noted Cardiovascular:     Rate and Rhythm: Normal rate.  Pulmonary:     Effort: Pulmonary effort is normal. No respiratory distress.  Musculoskeletal:     Cervical back: Normal range of motion. No tenderness.  Skin:    General: Skin is dry.     Findings: No rash.  Neurological:     General: No focal deficit present.     Mental Status: She is alert. Mental status is at baseline.     Cranial Nerves: No cranial nerve deficit.     Sensory: Sensory deficit present.     Motor: No weakness.     Comments: The patient reports numbness in  the V1 and V2 distribution on the right side of the face.  Reports some sensation present in the V3 distribution.  Cranial nerves otherwise are intact.  There is no facial droop noted.  She is answering questions appropriately appropriate speech.  Psychiatric:        Mood and Affect: Mood normal.     ED Results / Procedures / Treatments   Labs (all labs ordered are listed, but only abnormal results are displayed) Labs Reviewed  COMPREHENSIVE METABOLIC PANEL - Abnormal; Notable for the following components:      Result Value   Total Bilirubin 0.2 (*)    All other components within normal limits  CBC WITH DIFFERENTIAL/PLATELET - Abnormal; Notable for the following components:   Hemoglobin 9.5 (*)    HCT 31.0 (*)    MCV 74.3 (*)    MCH 22.8 (*)    RDW 17.2 (*)    Platelets 451 (*)    All other components within normal limits  HCG, QUANTITATIVE, PREGNANCY    EKG None  Radiology CT Maxillofacial W Contrast  Result Date: 10/10/2022 CLINICAL DATA:  Right side facial numbness, swelling. Concern for abscess EXAM: CT MAXILLOFACIAL WITH CONTRAST TECHNIQUE: Multidetector CT imaging of the maxillofacial structures was performed with intravenous contrast. Multiplanar CT image reconstructions were also generated. RADIATION DOSE REDUCTION: This exam was performed according to the departmental dose-optimization program which includes automated exposure control, adjustment of the mA and/or kV according to patient size and/or use of iterative reconstruction technique. CONTRAST:  75mL OMNIPAQUE IOHEXOL 300 MG/ML  SOLN COMPARISON:  None Available. FINDINGS: Osseous: No fracture or mandibular dislocation. No destructive process. Orbits: Negative. No traumatic or inflammatory finding. Sinuses: Clear Soft tissues: Negative Limited intracranial: None IMPRESSION: No acute bony abnormality. No evidence of soft tissue abnormality/abscess. Electronically Signed   By: Charlett Nose M.D.   On: 10/10/2022 18:29   CT  Head Wo Contrast  Result Date: 10/10/2022 CLINICAL DATA:  Right facial numbness EXAM: CT HEAD WITHOUT CONTRAST TECHNIQUE: Contiguous axial images were obtained from the base of the skull through the vertex without intravenous contrast. RADIATION DOSE REDUCTION: This exam was performed according to the departmental dose-optimization program which includes automated exposure control, adjustment of the mA and/or kV according to patient size and/or use of iterative reconstruction technique. COMPARISON:  08/04/2020 FINDINGS: Brain: No acute intracranial abnormality. Specifically, no hemorrhage, hydrocephalus, mass lesion, acute infarction, or significant intracranial injury. Vascular: No hyperdense vessel or unexpected calcification. Skull: No acute calvarial abnormality. Sinuses/Orbits: No acute findings Other: None IMPRESSION: Normal study. Electronically Signed   By: Charlett Nose M.D.   On: 10/10/2022 18:28    Procedures Procedures   Medications Ordered  in ED Medications  iohexol (OMNIPAQUE) 300 MG/ML solution 75 mL (75 mLs Intravenous Contrast Given 10/10/22 1536)  clindamycin (CLEOCIN) capsule 450 mg (450 mg Oral Given 10/10/22 2146)  ketorolac (TORADOL) 15 MG/ML injection 15 mg (15 mg Intravenous Given 10/10/22 2143)    ED Course/ Medical Decision Making/ A&P    Medical Decision Making Amount and/or Complexity of Data Reviewed Labs: ordered. Radiology: ordered.  Risk Prescription drug management.   42 y.o. female presents to the ER for evaluation of facial swelling. Differential diagnosis includes but is not limited to abscess, cellulitis, allergic reaction. Vital signs show mildly elevated blood pressure 124/93 otherwise unremarkable.  Afebrile. Physical exam as noted above.   I independently reviewed and interpreted the patient's labs.  CBC shows anemia with hemoglobin of 9.5 however this is consistent with her previous from January 2024 at 9.5.  Mildly increased platelets at 451.  No  leukocytosis.  CMP shows mildly decreased total bili at 0.2, otherwise no electrolyte or LFT abnormality.  hCG 1.  CT imaging shows No acute bony abnormality. No evidence of soft tissue abnormality/abscess.  I do feel like there is a questionable enhancing area around the right front tooth, where she is having pain, however radiology feels this is less likely.   The patient is having point tenderness and swelling. From looking at the picture on her phone, the swelling has greatly improved after the two days. The numbness is concerning. She may be having this from peripheral swelling from the tooth. She does not have any facial droop and her cranial nerves are otherwise intact. Strength and sensation is intact and symmetric in the upper and lower extremities.  Considered stroke but consider this less likely.  Patient declined MRI.  My attending assessed the patient at bedside. MRI was offered, however the patient declines. Will discharge home with clindamycin and dental resources.   We discussed the results of the labs/imaging. The plan is follow up with dentistry, take antibiotics, follow up with MRI. We discussed strict return precautions and red flag symptoms. The patient verbalized their understanding and agrees to the plan. The patient is stable and being discharged home in good condition.  Portions of this report may have been transcribed using voice recognition software. Every effort was made to ensure accuracy; however, inadvertent computerized transcription errors may be present.   I discussed this case with my attending physician who cosigned this note including patient's presenting symptoms, physical exam, and planned diagnostics and interventions. Attending physician stated agreement with plan or made changes to plan which were implemented.   Attending physician assessed patient at bedside.  Final Clinical Impression(s) / ED Diagnoses Final diagnoses:  Dental abscess  Facial swelling   Right facial numbness    Rx / DC Orders ED Discharge Orders          Ordered    clindamycin (CLEOCIN) 150 MG capsule  3 times daily        10/10/22 2056              Achille Rich, PA-C 10/10/22 2358    Rozelle Logan, DO 10/11/22 0041

## 2022-10-10 NOTE — ED Triage Notes (Signed)
Right facial swelling x 4 days , pain shooting to upper face and head , took clindamycin x 2 days , swelling got better .  Reports numbing sensation to face .  Had a tooth issues but is not  hurting .

## 2022-10-10 NOTE — ED Provider Notes (Signed)
I provided a substantive portion of the care of this patient.  I personally made/approved the management plan for this patient and take responsibility for the patient management.      Patient is experiencing facial swelling with pain.  Also perception of numbness and paresthesia of the right side of the face.  She has started clindamycin from an old prescription with some improvement.  On examination patient does have tender and objective swelling of the anterior portion of the maxilla around the philtrum and upper lip.  He does have poor dentition and with palpation of the gumline above the front incisors there is some induration and swelling consistent with likely dental infection.  Patient does not have trismus.  Posterior airway widely patent.  Floor of the mouth is soft.  I agree with plan to proceed with a the study of the facial bones and evaluate for deeper abscess.  CT results are pending.  Plan will be to evaluate this for abscess or deep space infection of the soft tissues.  Patient is nontoxic without trismus or any airway involvement.  Will be appropriate for antibiotic therapy and possible incision and drainage depending on findings.  I agree on plan of management.   Arby Barrette, MD 10/23/22 1326
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
## 2023-09-25 ENCOUNTER — Encounter (HOSPITAL_BASED_OUTPATIENT_CLINIC_OR_DEPARTMENT_OTHER): Payer: Self-pay | Admitting: *Deleted

## 2023-09-25 ENCOUNTER — Other Ambulatory Visit: Payer: Self-pay

## 2023-09-25 ENCOUNTER — Emergency Department (HOSPITAL_BASED_OUTPATIENT_CLINIC_OR_DEPARTMENT_OTHER)
Admission: EM | Admit: 2023-09-25 | Discharge: 2023-09-25 | Disposition: A | Discharge status: Home / Self Care | Attending: Emergency Medicine | Admitting: Emergency Medicine

## 2023-09-25 DIAGNOSIS — H9201 Otalgia, right ear: Secondary | ICD-10-CM | POA: Diagnosis present

## 2023-09-25 DIAGNOSIS — R509 Fever, unspecified: Secondary | ICD-10-CM | POA: Diagnosis not present

## 2023-09-25 DIAGNOSIS — F17210 Nicotine dependence, cigarettes, uncomplicated: Secondary | ICD-10-CM | POA: Insufficient documentation

## 2023-09-25 DIAGNOSIS — J029 Acute pharyngitis, unspecified: Secondary | ICD-10-CM | POA: Diagnosis not present

## 2023-09-25 LAB — RESP PANEL BY RT-PCR (RSV, FLU A&B, COVID)  RVPGX2
Influenza A by PCR: NEGATIVE
Influenza B by PCR: NEGATIVE
Resp Syncytial Virus by PCR: NEGATIVE
SARS Coronavirus 2 by RT PCR: NEGATIVE

## 2023-09-25 LAB — GROUP A STREP BY PCR: Group A Strep by PCR: NOT DETECTED

## 2023-09-25 MED ORDER — IBUPROFEN 800 MG PO TABS
800.0000 mg | ORAL_TABLET | Freq: Once | ORAL | Status: AC
  Administered 2023-09-25: 800 mg via ORAL
  Filled 2023-09-25: qty 1

## 2023-09-25 MED ORDER — AZITHROMYCIN 250 MG PO TABS: 250.0000 mg | ORAL_TABLET | Freq: Every day | ORAL | 0 refills | Status: AC

## 2023-09-25 MED ORDER — AZITHROMYCIN 250 MG PO TABS
500.0000 mg | ORAL_TABLET | Freq: Once | ORAL | Status: AC
  Administered 2023-09-25: 500 mg via ORAL
  Filled 2023-09-25: qty 2

## 2023-09-25 NOTE — Discharge Instructions (Signed)
 You were evaluated in the Emergency Department and after careful evaluation, we did not find any emergent condition requiring admission or further testing in the hospital.  Your exam/testing today was overall reassuring.  We are treating you for possible strep throat and/or ear infection.  Take the azithromycin  antibiotic as directed.  Continue Tylenol  or Motrin  for discomfort.  Please return to the Emergency Department if you experience any worsening of your condition.  Thank you for allowing us  to be a part of your care.

## 2023-09-25 NOTE — ED Triage Notes (Addendum)
 Patient present with rt ear pain that radiates down rt neck. Onset 2 days ago. Fever on Friday  103. Denies any drainage.  However pulling sensation when swallow.Pain on swallowing.  No hx of ear infection.Denies sick contact.

## 2023-09-25 NOTE — ED Provider Notes (Signed)
 [facility] Emergency Department Provider Note MRN:  [id number]  Arrival date & time: 09/25/23     Chief Complaint   Otalgia   History of Present Illness   Haley Gill is a 43 y.o. year-old female with no pertinent past medical history presenting to the ED with chief complaint of otalgia.  Started having right ear pain today as well as sore throat.  Fever up to 103 reported.  No cough, no other complaints.  Review of Systems  A thorough review of systems was obtained and all systems are negative except as noted in the HPI and PMH.   Patient's Health History    Past Medical History:  Diagnosis Date   Focal nodular hyperplasia of liver    Migraines    Rectus diastasis of lower abdomen    Vitamin D deficiency     Past Surgical History:  Procedure Laterality Date   ceasarean  2015    History reviewed. No pertinent family history.  Social History   Socioeconomic History   Marital status: Single    Spouse name: Not on file   Number of children: Not on file   Years of education: Not on file   Highest education level: Not on file  Occupational History   Not on file  Tobacco Use   Smoking status: Every Day    Types: Cigarettes   Smokeless tobacco: Never  Vaping Use   Vaping status: Never Used  Substance and Sexual Activity   Alcohol use: Yes    Comment: weekly   Drug use: Not on file   Sexual activity: Not on file  Other Topics Concern   Not on file  Social History Narrative   Not on file   Social Drivers of Health   Financial Resource Strain: Not on file  Food Insecurity: Low Risk  (03/13/2023)   Received from [facility]   Hunger Vital Sign    Within the past 12 months, you worried that your food would run out before you got money to buy more: Never true    Within the past 12 months, the food you bought just didn't last and you didn't have money to get more. : Never true  Transportation Needs: No Transportation Needs (03/13/2023)    Received from [employer]    In the past 12 months, has lack of reliable transportation kept you from medical appointments, meetings, work or from getting things needed for daily living? : No  Physical Activity: Not on file  Stress: Not on file  Social Connections: Not on file  Intimate Partner Violence: Not on file     Physical Exam   Vitals:   09/25/23 0050  BP: (!) 148/107  Pulse: 92  Resp: 18  Temp: 98.7 F (37.1 C)  SpO2: 99%    CONSTITUTIONAL: Well-appearing, NAD NEURO/PSYCH:  Alert and oriented x 3, no focal deficits EYES:  eyes equal and reactive ENT/NECK:  no LAD, no JVD CARDIO: Regular rate, well-perfused, normal S1 and S2 PULM:  CTAB no wheezing or rhonchi GI/GU:  non-distended, non-tender MSK/SPINE:  No gross deformities, no edema SKIN:  no rash, atraumatic   *Additional and/or pertinent findings included in MDM below  Diagnostic and Interventional Summary    EKG Interpretation Date/Time:    Ventricular Rate:    PR Interval:    QRS Duration:    QT Interval:    QTC Calculation:   R Axis:      Text Interpretation:  Labs Reviewed  GROUP A STREP BY PCR  RESP PANEL BY RT-PCR (RSV, FLU A&B, COVID)  [id number]    No orders to display    Medications  azithromycin  (ZITHROMAX ) tablet 500 mg (has no administration in time range)     Procedures  /  Critical Care Procedures  ED Course and Medical Decision Making  Initial Impression and Ddx Mild erythema to the right TM, swollen and erythematous tonsils bilaterally.  Report of high fever at home.  No meningismus, reassuring vital signs here in the emergency department.  No cough, lungs clear, abdomen soft.  Past medical/surgical history that increases complexity of ED encounter: None  Interpretation of Diagnostics Laboratory and/or imaging options to aid in the diagnosis/care of the patient were considered.  After careful history and physical examination, it was determined  that there was no indication for diagnostics at this time.  Patient Reassessment and Ultimate Disposition/Management     Will discharge with empiric treatment of strep versus otitis.  No signs of more significant infection at this time.  Return precautions discussed.  Patient management required discussion with the following services or consulting groups:  None  Complexity of Problems Addressed Acute complicated illness or Injury  Additional Data Reviewed and Analyzed Further history obtained from: None  Additional Factors Impacting ED Encounter Risk Prescriptions  Ozell HERO. Theadore, MD [facility] Emergency Medicine [facility] [email or web address]  Final Clinical Impressions(s) / ED Diagnoses     ICD-10-CM   1. Right ear pain  H92.01     2. Sore throat  J02.9       ED Discharge Orders          Ordered    azithromycin  (ZITHROMAX ) 250 MG tablet  Daily        09/25/23 0145             Discharge Instructions Discussed with and Provided to Patient:    Discharge Instructions      You were evaluated in the Emergency Department and after careful evaluation, we did not find any emergent condition requiring admission or further testing in the hospital.  Your exam/testing today was overall reassuring.  We are treating you for possible strep throat and/or ear infection.  Take the azithromycin  antibiotic as directed.  Continue Tylenol  or Motrin  for discomfort.  Please return to the Emergency Department if you experience any worsening of your condition.  Thank you for allowing us  to be a part of your care.       Theadore Ozell HERO, MD 09/25/23 [phone]

## 2023-10-11 ENCOUNTER — Other Ambulatory Visit: Payer: Self-pay

## 2023-10-11 ENCOUNTER — Emergency Department (HOSPITAL_BASED_OUTPATIENT_CLINIC_OR_DEPARTMENT_OTHER)

## 2023-10-11 ENCOUNTER — Emergency Department (HOSPITAL_BASED_OUTPATIENT_CLINIC_OR_DEPARTMENT_OTHER): Admission: EM | Admit: 2023-10-11 | Discharge: 2023-10-11 | Disposition: A | Discharge status: Home / Self Care

## 2023-10-11 ENCOUNTER — Encounter (HOSPITAL_BASED_OUTPATIENT_CLINIC_OR_DEPARTMENT_OTHER): Payer: Self-pay | Admitting: Emergency Medicine

## 2023-10-11 DIAGNOSIS — Y92410 Unspecified street and highway as the place of occurrence of the external cause: Secondary | ICD-10-CM | POA: Diagnosis not present

## 2023-10-11 DIAGNOSIS — S29001A Unspecified injury of muscle and tendon of front wall of thorax, initial encounter: Secondary | ICD-10-CM | POA: Diagnosis present

## 2023-10-11 DIAGNOSIS — S20211A Contusion of right front wall of thorax, initial encounter: Secondary | ICD-10-CM | POA: Insufficient documentation

## 2023-10-11 DIAGNOSIS — M62830 Muscle spasm of back: Secondary | ICD-10-CM | POA: Diagnosis not present

## 2023-10-11 DIAGNOSIS — F419 Anxiety disorder, unspecified: Secondary | ICD-10-CM | POA: Insufficient documentation

## 2023-10-11 DIAGNOSIS — S298XXA Other specified injuries of thorax, initial encounter: Secondary | ICD-10-CM

## 2023-10-11 MED ORDER — KETOROLAC TROMETHAMINE 15 MG/ML IJ SOLN
15.0000 mg | Freq: Once | INTRAMUSCULAR | Status: AC
  Administered 2023-10-11: 15 mg via INTRAMUSCULAR
  Filled 2023-10-11: qty 1

## 2023-10-11 MED ORDER — METHOCARBAMOL 500 MG PO TABS
1000.0000 mg | ORAL_TABLET | Freq: Once | ORAL | Status: AC
  Administered 2023-10-11: 1000 mg via ORAL
  Filled 2023-10-11: qty 2

## 2023-10-11 MED ORDER — LIDOCAINE 5 % EX PTCH: 1.0000 | MEDICATED_PATCH | CUTANEOUS | 0 refills | Status: DC

## 2023-10-11 MED ORDER — METHOCARBAMOL 500 MG PO TABS: 1000.0000 mg | ORAL_TABLET | Freq: Four times a day (QID) | ORAL | 0 refills | Status: DC | PRN
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
## 2023-10-11 NOTE — Discharge Instructions (Signed)
 You can alternate Tylenol  and Motrin  every 3 hours as needed for pain.  You can take 500 to 1000 mg of Tylenol  3 times a day and 600-800 mg of ibuprofen  3 times a day.  You can also use the Robaxin up to 4 times a day as needed for spasms and pain.  You can also use heat and ice as needed or your lidocaine patches as well.  You will likely be more sore tomorrow and the next day, but your symptoms will slowly start to improve on their own.
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
## 2023-10-11 NOTE — ED Notes (Signed)
 Pt tearful on assessment, crying and on the phone and saying that she can't breathe repetitively. Dr. Gennaro in to see pt. C-collar removed. See new orders.

## 2023-10-12 ENCOUNTER — Emergency Department (HOSPITAL_COMMUNITY)

## 2023-10-12 ENCOUNTER — Emergency Department (HOSPITAL_COMMUNITY)
Admission: EM | Admit: 2023-10-12 | Discharge: 2023-10-12 | Discharge status: Against Medical Advice | Attending: Emergency Medicine | Admitting: Emergency Medicine

## 2023-10-12 ENCOUNTER — Other Ambulatory Visit: Payer: Self-pay

## 2023-10-12 ENCOUNTER — Emergency Department (HOSPITAL_COMMUNITY)
Admission: EM | Admit: 2023-10-12 | Discharge: 2023-10-12 | Disposition: A | Discharge status: Home / Self Care | Source: Home / Self Care | Attending: Emergency Medicine | Admitting: Emergency Medicine

## 2023-10-12 ENCOUNTER — Encounter (HOSPITAL_COMMUNITY): Payer: Self-pay

## 2023-10-12 DIAGNOSIS — M545 Low back pain, unspecified: Secondary | ICD-10-CM | POA: Insufficient documentation

## 2023-10-12 DIAGNOSIS — M549 Dorsalgia, unspecified: Secondary | ICD-10-CM | POA: Insufficient documentation

## 2023-10-12 DIAGNOSIS — Z5321 Procedure and treatment not carried out due to patient leaving prior to being seen by health care provider: Secondary | ICD-10-CM | POA: Diagnosis not present

## 2023-10-12 DIAGNOSIS — S161XXA Strain of muscle, fascia and tendon at neck level, initial encounter: Secondary | ICD-10-CM | POA: Insufficient documentation

## 2023-10-12 DIAGNOSIS — M546 Pain in thoracic spine: Secondary | ICD-10-CM | POA: Insufficient documentation

## 2023-10-12 DIAGNOSIS — R0781 Pleurodynia: Secondary | ICD-10-CM | POA: Insufficient documentation

## 2023-10-12 DIAGNOSIS — Y92410 Unspecified street and highway as the place of occurrence of the external cause: Secondary | ICD-10-CM | POA: Insufficient documentation

## 2023-10-12 DIAGNOSIS — R519 Headache, unspecified: Secondary | ICD-10-CM | POA: Insufficient documentation

## 2023-10-12 DIAGNOSIS — S199XXA Unspecified injury of neck, initial encounter: Secondary | ICD-10-CM | POA: Diagnosis present

## 2023-10-12 LAB — CBC
HCT: 33 % — ABNORMAL LOW (ref 36.0–46.0)
Hemoglobin: 10 g/dL — ABNORMAL LOW (ref 12.0–15.0)
MCH: 22.5 pg — ABNORMAL LOW (ref 26.0–34.0)
MCHC: 30.3 g/dL (ref 30.0–36.0)
MCV: 74.3 fL — ABNORMAL LOW (ref 80.0–100.0)
Platelets: 442 K/uL — ABNORMAL HIGH (ref 150–400)
RBC: 4.44 MIL/uL (ref 3.87–5.11)
RDW: 16.3 % — ABNORMAL HIGH (ref 11.5–15.5)
WBC: 3.9 K/uL — ABNORMAL LOW (ref 4.0–10.5)
nRBC: 0 % (ref 0.0–0.2)

## 2023-10-12 LAB — BASIC METABOLIC PANEL WITH GFR
Anion gap: 7 (ref 5–15)
BUN: 13 mg/dL (ref 6–20)
CO2: 22 mmol/L (ref 22–32)
Calcium: 9.2 mg/dL (ref 8.9–10.3)
Chloride: 107 mmol/L (ref 98–111)
Creatinine, Ser: 0.81 mg/dL (ref 0.44–1.00)
GFR, Estimated: >60 mL/min (ref >60)
Glucose, Bld: 85 mg/dL (ref 70–99)
Potassium: 3.9 mmol/L (ref 3.5–5.1)
Sodium: 136 mmol/L (ref 135–145)

## 2023-10-12 LAB — HCG, SERUM, QUALITATIVE: Preg, Serum: NEGATIVE

## 2023-10-12 MED ORDER — KETOROLAC TROMETHAMINE 15 MG/ML IJ SOLN
15.0000 mg | Freq: Once | INTRAMUSCULAR | Status: AC
  Administered 2023-10-12: 15 mg via INTRAVENOUS
  Filled 2023-10-12: qty 1

## 2023-10-12 MED ORDER — METHOCARBAMOL 500 MG PO TABS
1000.0000 mg | ORAL_TABLET | Freq: Once | ORAL | Status: AC
  Administered 2023-10-12: 1000 mg via ORAL
  Filled 2023-10-12: qty 2

## 2023-10-12 MED ORDER — IOHEXOL 300 MG/ML  SOLN
100.0000 mL | Freq: Once | INTRAMUSCULAR | Status: AC | PRN
Start: 2023-10-12 — End: 2023-10-12
  Administered 2023-10-12: 100 mL via INTRAVENOUS

## 2023-10-12 MED ORDER — OXYCODONE HCL 5 MG PO CAPS: 5.0000 mg | ORAL_CAPSULE | Freq: Four times a day (QID) | ORAL | 0 refills | Status: AC | PRN

## 2023-10-12 MED ORDER — OXYCODONE HCL 5 MG PO TABS
5.0000 mg | ORAL_TABLET | Freq: Four times a day (QID) | ORAL | Status: DC | PRN
Start: 2023-10-12 — End: 2023-10-13

## 2023-10-12 MED ORDER — LIDOCAINE 5 % EX PTCH: 1.0000 | MEDICATED_PATCH | CUTANEOUS | 0 refills | Status: AC

## 2023-10-12 MED ORDER — METHOCARBAMOL 500 MG PO TABS: 500.0000 mg | ORAL_TABLET | Freq: Two times a day (BID) | ORAL | 0 refills | Status: AC

## 2023-10-12 MED ORDER — NAPROXEN 500 MG PO TABS: 500.0000 mg | ORAL_TABLET | Freq: Two times a day (BID) | ORAL | 0 refills | Status: AC
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
## 2023-10-12 NOTE — Discharge Instructions (Signed)
 I would recommend ice heat and lidocaine patch over area of pain.  You can also take naproxen and Tylenol .  Take Robaxin for muscle spasm.  Take oxycodone  for breakthrough pain.  Return to ER with new or worsening symptoms.
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
